# Patient Record
Sex: Female | Born: 1990 | Race: White | Hispanic: No | Marital: Single | Smoking: Never smoker
Health system: Southern US, Community
[De-identification: ages and names within clinical notes are randomized; demographics above are authoritative.]

## PROBLEM LIST (undated history)

## (undated) DIAGNOSIS — Z87898 Personal history of other specified conditions: Secondary | ICD-10-CM

## (undated) DIAGNOSIS — L409 Psoriasis, unspecified: Secondary | ICD-10-CM

## (undated) DIAGNOSIS — R569 Unspecified convulsions: Secondary | ICD-10-CM

## (undated) DIAGNOSIS — G43909 Migraine, unspecified, not intractable, without status migrainosus: Secondary | ICD-10-CM

## (undated) HISTORY — PX: BACK SURGERY: SHX140

## (undated) HISTORY — DX: Personal history of other specified conditions: Z87.898

---

## 2013-10-30 ENCOUNTER — Encounter: Payer: Self-pay | Admitting: Emergency Medicine

## 2013-10-30 ENCOUNTER — Emergency Department
Admission: EM | Admit: 2013-10-30 | Discharge: 2013-10-30 | Disposition: A | Discharge status: Home / Self Care | Payer: 59 | Source: Home / Self Care | Attending: Family Medicine | Admitting: Family Medicine

## 2013-10-30 DIAGNOSIS — J069 Acute upper respiratory infection, unspecified: Secondary | ICD-10-CM

## 2013-10-30 DIAGNOSIS — J029 Acute pharyngitis, unspecified: Secondary | ICD-10-CM

## 2013-10-30 HISTORY — DX: Psoriasis, unspecified: L40.9

## 2013-10-30 HISTORY — DX: Unspecified convulsions: R56.9

## 2013-10-30 HISTORY — DX: Migraine, unspecified, not intractable, without status migrainosus: G43.909

## 2013-10-30 LAB — POCT RAPID STREP A (OFFICE): Rapid Strep A Screen: NEGATIVE

## 2013-10-30 MED ORDER — BENZONATATE 200 MG PO CAPS: 200.0000 mg | ORAL_CAPSULE | Freq: Every day | ORAL | Status: DC

## 2013-10-30 MED ORDER — AMOXICILLIN 875 MG PO TABS: 875.0000 mg | ORAL_TABLET | Freq: Two times a day (BID) | ORAL | Status: DC

## 2013-10-30 NOTE — ED Notes (Signed)
Sore throat, nasal drainage, rt ear ringing x 6 days a little nausea today

## 2013-10-30 NOTE — ED Provider Notes (Signed)
CSN: [id number]     Arrival date & time 10/30/13  1547 History   First MD Initiated Contact with Patient 10/30/13 1604     Chief Complaint  Patient presents with  . Sore Throat      HPI Comments: Patient developed increased sinus drainage about 5 days ago, followed by fatigue, headache, soreness in her neck, and a sore throat.  Four days ago she developed a cough that has persisted, and her sore throat is now minimal.  The history is provided by the patient.    Past Medical History  Diagnosis Date  . Seizures   . Psoriasis   . Migraines    Past Surgical History  Procedure Laterality Date  . Back surgery     Family History  Problem Relation Age of Onset  . Supraventricular tachycardia Mother    History  Substance Use Topics  . Smoking status: Never Smoker   . Smokeless tobacco: Not on file  . Alcohol Use: Yes   OB History   Grav Para Term Preterm Abortions TAB SAB Ect Mult Living                 Review of Systems + sore throat + cough No pleuritic pain No wheezing + nasal congestion + post-nasal drainage No sinus pain/pressure No itchy/red eyes No earache No hemoptysis No SOB No fever/chills + nausea + vomiting, resolved + abdominal pain No diarrhea No urinary symptoms No skin rash + fatigue + myalgias + headache Used OTC meds without relief  Allergies  Pertussis vaccines  Home Medications   Current Outpatient Rx  Name  Route  Sig  Dispense  Refill  . ibuprofen (ADVIL,MOTRIN) 200 MG tablet   Oral   Take 200 mg by mouth every 6 (six) hours as needed.         Marland Kitchen. amoxicillin (AMOXIL) 875 MG tablet   Oral   Take 1 tablet (875 mg total) by mouth 2 (two) times daily. (Rx void after 11/07/13)   20 tablet   0   . benzonatate (TESSALON) 200 MG capsule   Oral   Take 1 capsule (200 mg total) by mouth at bedtime. Take as needed for cough   12 capsule   0    BP 137/83  Pulse 102  Temp(Src) 98 F (36.7 C) (Oral)  Ht 5\' 4"  (1.626 m)  Wt 203 lb  (92.08 kg)  BMI 34.83 kg/m2  SpO2 99% Physical Exam Nursing notes and Vital Signs reviewed. Appearance:  Patient appears stated age, and in no acute distress.  Patient is obese (BMI 34.8) Eyes:  Pupils are equal, round, and reactive to light and accomodation.  Extraocular movement is intact.  Conjunctivae are not inflamed  Ears:  Canals normal.  Tympanic membranes normal.  Nose:  Mildly congested turbinates.  No sinus tenderness.   Pharynx:   Minimal erythema Neck:  Supple.  Tender shotty posterior nodes are palpated bilaterally  Lungs:  Clear to auscultation.  Breath sounds are equal.  Chest:  Distinct tenderness to palpation over the mid-sternum.  Heart:  Regular rate and rhythm without murmurs, rubs, or gallops.  Abdomen:  Nontender without masses or hepatosplenomegaly.  Bowel sounds are present.  No CVA or flank tenderness.  Extremities:  No edema.  No calf tenderness Skin:  No rash present.   ED Course  Procedures  none   Labs Reviewed -  POCT rapid strep test negative       MDM   1. Acute  pharyngitis   2. Acute upper respiratory infections of unspecified site    There is no evidence of bacterial infection today.  Treat symptomatically for now: Prescription written for Benzonatate [facility]) to take at bedtime for night-time cough.  Take plain Mucinex (1200 mg guaifenesin) twice daily for cough and congestion.  May add Sudafed for sinus congestion.   Increase fluid intake, rest. May use Afrin nasal spray (or generic oxymetazoline) twice daily for about 5 days.  Also recommend using saline nasal spray several times daily and saline nasal irrigation (AYR is a common brand) Try warm salt water gargles for sore throat.  Stop all antihistamines for now, and other non-prescription cough/cold preparations. May take Ibuprofen 200mg , 4 tabs every 8 hours with food for chest/sternum discomfort, headache, fever, etc. Begin Amoxicillin if not improving about 5 days or if persistent  fever develops (Given a prescription to hold, with an expiration date)  Follow-up with family doctor if not improving 7 to 10 days.    Lattie Haw, MD 11/01/13 [phone]

## 2013-10-30 NOTE — Discharge Instructions (Signed)
Take plain Mucinex (1200 mg guaifenesin) twice daily for cough and congestion.  May add Sudafed for sinus congestion.   Increase fluid intake, rest. May use Afrin nasal spray (or generic oxymetazoline) twice daily for about 5 days.  Also recommend using saline nasal spray several times daily and saline nasal irrigation (AYR is a common brand) Try warm salt water gargles for sore throat.  Stop all antihistamines for now, and other non-prescription cough/cold preparations. May take Ibuprofen 200mg , 4 tabs every 8 hours with food for chest/sternum discomfort, headache, fever, etc. Begin Amoxicillin if not improving about 5 days or if persistent fever develops  Follow-up with family doctor if not improving 7 to 10 days.    Salt Water Gargle This solution will help make your mouth and throat feel better. HOME CARE INSTRUCTIONS   Mix 1 teaspoon of salt in 8 ounces of warm water.  Gargle with this solution as much or often as you need or as directed. Swish and gargle gently if you have any sores or wounds in your mouth.  Do not swallow this mixture. Document Released: 06/25/2004 Document Revised: 12/14/2011 Document Reviewed: 11/16/2008 [facility] Patient Information 2014 [place], [place].

## 2015-12-27 ENCOUNTER — Encounter: Payer: Self-pay | Admitting: Osteopathic Medicine

## 2015-12-27 ENCOUNTER — Ambulatory Visit (INDEPENDENT_AMBULATORY_CARE_PROVIDER_SITE_OTHER): Payer: 59 | Admitting: Osteopathic Medicine

## 2015-12-27 VITALS — BP 139/82 | HR 91 | Ht 63.0 in | Wt 195.0 lb

## 2015-12-27 DIAGNOSIS — Z8669 Personal history of other diseases of the nervous system and sense organs: Secondary | ICD-10-CM | POA: Diagnosis not present

## 2015-12-27 DIAGNOSIS — R002 Palpitations: Secondary | ICD-10-CM | POA: Diagnosis not present

## 2015-12-27 DIAGNOSIS — Z872 Personal history of diseases of the skin and subcutaneous tissue: Secondary | ICD-10-CM

## 2015-12-27 DIAGNOSIS — R946 Abnormal results of thyroid function studies: Secondary | ICD-10-CM | POA: Diagnosis not present

## 2015-12-27 DIAGNOSIS — R7989 Other specified abnormal findings of blood chemistry: Secondary | ICD-10-CM

## 2015-12-27 DIAGNOSIS — Z87898 Personal history of other specified conditions: Secondary | ICD-10-CM

## 2015-12-27 HISTORY — DX: Personal history of other specified conditions: Z87.898

## 2015-12-27 LAB — CBC WITH DIFFERENTIAL/PLATELET
BASOS PCT: 0 % (ref 0–1)
Basophils Absolute: 0 10*3/uL (ref 0.0–0.1)
EOS PCT: 1 % (ref 0–5)
Eosinophils Absolute: 0.1 10*3/uL (ref 0.0–0.7)
HCT: 39.4 % (ref 36.0–46.0)
Hemoglobin: 12.9 g/dL (ref 12.0–15.0)
LYMPHS ABS: 1.6 10*3/uL (ref 0.7–4.0)
Lymphocytes Relative: 31 % (ref 12–46)
MCH: 26.6 pg (ref 26.0–34.0)
MCHC: 32.7 g/dL (ref 30.0–36.0)
MCV: 81.2 fL (ref 78.0–100.0)
MONOS PCT: 7 % (ref 3–12)
MPV: 9.5 fL (ref 8.6–12.4)
Monocytes Absolute: 0.4 10*3/uL (ref 0.1–1.0)
NEUTROS PCT: 61 % (ref 43–77)
Neutro Abs: 3.1 10*3/uL (ref 1.7–7.7)
PLATELETS: 325 10*3/uL (ref 150–400)
RBC: 4.85 MIL/uL (ref 3.87–5.11)
RDW: 12.7 % (ref 11.5–15.5)
WBC: 5.1 10*3/uL (ref 4.0–10.5)

## 2015-12-27 LAB — TSH: TSH: 0.02 mIU/L — ABNORMAL LOW

## 2015-12-27 NOTE — Progress Notes (Signed)
HPI: Latoya Obrien is a 25 y.o. female who [facility] for chief complaint of:  Chief Complaint  Patient presents with  . Establish Care    Concerned about elevated heart rate during exercise    . Quality: Patient reports occasional heart racing, worse with exercise, very rarely happens on its own. . Duration: Ongoing past few months as patient has been increasing exercise, doing well with intentional weight loss . Timing: Worse with exercise . Context: On [employer] HR monitor, she tried it and HR was high.  . Modifying factors: Improves with rest . Assoc signs/symptoms: Reports some occasional chest discomfort with strenuous exercise Patient brings a log of heart rate, preventive 120s or so following exercise, patient is concerned because it seems to take up to 10 minutes or so to get back down into the lower 90s. She doesn't always report palpitations or chest pain with this, she is just keeping an eye on her heart rate with her fitness monitor   Past medical, social and family history reviewed: Past Medical History  Diagnosis Date  . Seizures (HCC)   . Psoriasis   . Migraines     once or twice per year, severe  . History of seizure 12/27/2015    History of seizure x 1 as child, age 89.    Past Surgical History  Procedure Laterality Date  . Back surgery     Social History  Substance Use Topics  . Smoking status: Never Smoker   . Smokeless tobacco: Not on file  . Alcohol Use: Yes   Family History  Problem Relation Age of Onset  . Supraventricular tachycardia Mother     No current outpatient prescriptions on file.   No current facility-administered medications for this visit.   Allergies  Allergen Reactions  . Pertussis Vaccines       Review of Systems: CONSTITUTIONAL:  No  fever, no chills, No  unintentional weight changesPatient reports intentional weight loss with exercise regimen,  HEAD/EYES/EARS/NOSE/THROAT: No   headache, no vision change, no hearing change, CARDIAC: (+) Occasional chest pain, No  pressure, (occasional +) palpitations, No  Orthopnea - see history of present illness  RESPIRATORY: No  cough, No  shortness of breath/wheeze GASTROINTESTINAL: No  nausea, No  vomiting, No  abdominal pain, GENITOURINARY: No  incontinence, No  abnormal genital bleeding/discharge SKIN: No  rash/wounds/concerning lesions HEM/ONC: No  easy bruising/bleeding, No  abnormal lymph node ENDOCRINE: No polyuria/polydipsia/polyphagia, No  heat/cold intolerance  NEUROLOGIC: No  weakness, No  dizziness, No  slurred speech PSYCHIATRIC: No  concerns with depression, No  concerns with anxiety, No sleep problems  Exam:  BP 139/82 mmHg  Pulse 91  Ht 5\' 3"  (1.6 m)  Wt 195 lb (88.451 kg)  BMI 34.55 kg/m2 Constitutional: VS see above. General Appearance: alert, well-developed, well-nourished, NAD Eyes: Normal lids and conjunctive, non-icteric sclera,  Ears, Nose, Mouth, Throat: MMM, Normal external inspection ears/nares/mouth/lips/gums,  Neck: No masses, trachea midline. No thyroid enlargement/tenderness/mass appreciated, thyroid at other limit of normal size. No lymphadenopathy Respiratory: Normal respiratory effort. no wheeze, no rhonchi, no rales Cardiovascular: S1/S2 normal, no murmur, no rub/gallop auscultated. RRR. No carotid bruit or JVD. Marland Kitchen. No lower extremity edema. Musculoskeletal: Gait normal. No clubbing/cyanosis of digits.  Neurological: No cranial nerve deficit on limited exam. Motor and sensation intact and symmetric Skin: warm, dry, intact. No rash/ulcer. No concerning nevi or subq nodules on limited exam.   Psychiatric: Normal judgment/insight. Normal mood and  affect. Oriented x3.    No results found for this or any previous visit (from the past 72 hour(s)).  EKG interpretation: Rate: 89 Rhythm: sinus No ST/T changes concerning for acute ischemia/infarct    ASSESSMENT/PLAN: no concerns on EKG, no  family history of WPW though mom does have history of SVT however this was associated with pregnancy. Labs as below to rule out secondary cause. ER/RTC precautions reviewed. Also reviewed American Heart Association target heart rate for exercise based on age. She is within normal parameters, would probably expect heart rate to decrease last baseline more quickly after cessation of exercise as cardiovascular fitness improves, patient also advised to do regular cooldown exercises with walking and stretching, which she has generally been doing.   Palpitations - Plan: EKG 12-Lead, CBC with Differential/Platelet, COMPLETE METABOLIC PANEL WITH GFR, TSH, Magnesium  History of seizure   Return in about 1 week (around 01/03/2016), or sooner if needed, for SKIN TAG/MOLE REMOVAL AND DISCUSS LAB RESULTS.

## 2015-12-28 ENCOUNTER — Encounter: Payer: Self-pay | Admitting: Osteopathic Medicine

## 2015-12-28 DIAGNOSIS — Z872 Personal history of diseases of the skin and subcutaneous tissue: Secondary | ICD-10-CM | POA: Insufficient documentation

## 2015-12-28 LAB — COMPLETE METABOLIC PANEL WITH GFR
ALT: 22 U/L (ref 6–29)
AST: 20 U/L (ref 10–30)
Albumin: 4.1 g/dL (ref 3.6–5.1)
Alkaline Phosphatase: 60 U/L (ref 33–115)
BUN: 8 mg/dL (ref 7–25)
CHLORIDE: 102 mmol/L (ref 98–110)
CO2: 25 mmol/L (ref 20–31)
Calcium: 9.2 mg/dL (ref 8.6–10.2)
Creat: 0.57 mg/dL (ref 0.50–1.10)
GLUCOSE: 83 mg/dL (ref 65–99)
POTASSIUM: 3.9 mmol/L (ref 3.5–5.3)
SODIUM: 140 mmol/L (ref 135–146)
Total Bilirubin: 0.5 mg/dL (ref 0.2–1.2)
Total Protein: 6.6 g/dL (ref 6.1–8.1)

## 2015-12-28 LAB — MAGNESIUM: MAGNESIUM: 1.7 mg/dL (ref 1.5–2.5)

## 2015-12-30 NOTE — Addendum Note (Signed)
Addended by: Deirdre PippinsALEXANDER, Kerrick Miler M on: 12/30/2015 08:32 AM   Modules accepted: Orders

## 2016-01-03 ENCOUNTER — Encounter: Payer: Self-pay | Admitting: Osteopathic Medicine

## 2016-01-03 ENCOUNTER — Ambulatory Visit (INDEPENDENT_AMBULATORY_CARE_PROVIDER_SITE_OTHER): Payer: 59 | Admitting: Osteopathic Medicine

## 2016-01-03 ENCOUNTER — Ambulatory Visit: Payer: 59

## 2016-01-03 VITALS — BP 120/78 | HR 91 | Ht 63.0 in | Wt 196.0 lb

## 2016-01-03 DIAGNOSIS — R946 Abnormal results of thyroid function studies: Secondary | ICD-10-CM

## 2016-01-03 DIAGNOSIS — E049 Nontoxic goiter, unspecified: Secondary | ICD-10-CM

## 2016-01-03 DIAGNOSIS — R7989 Other specified abnormal findings of blood chemistry: Secondary | ICD-10-CM

## 2016-01-03 LAB — T3: T3, Total: 231 ng/dL — ABNORMAL HIGH (ref 76–181)

## 2016-01-03 LAB — TSH: TSH: 0.01 m[IU]/L — AB

## 2016-01-03 LAB — T4, FREE: Free T4: 1.9 ng/dL — ABNORMAL HIGH (ref 0.8–1.8)

## 2016-01-03 MED ORDER — PROPRANOLOL HCL 20 MG PO TABS: 10.0000 mg | ORAL_TABLET | Freq: Four times a day (QID) | ORAL | Status: DC | PRN

## 2016-01-03 NOTE — Patient Instructions (Signed)
Hyperthyroidism Hyperthyroidism is when the thyroid is too active (overactive). Your thyroid is a large gland that is located in your neck. The thyroid helps to control how your body uses food (metabolism). When your thyroid is overactive, it produces too much of a hormone called thyroxine.  CAUSES Causes of hyperthyroidism may include:  Graves disease. This is when your immune system attacks the thyroid gland. This is the most common cause.  Inflammation of the thyroid gland.  Tumor in the thyroid gland or somewhere else.  Excessive use of thyroid medicines, including:  Prescription thyroid supplement.  Herbal supplements that mimic thyroid hormones.  Solid or fluid-filled lumps within your thyroid gland (thyroid nodules).  Excessive ingestion of iodine. RISK FACTORS  Being female.  Having a family history of thyroid conditions. SIGNS AND SYMPTOMS Signs and symptoms of hyperthyroidism may include:  Nervousness.  Inability to tolerate heat.  Unexplained weight loss.  Diarrhea.  Change in the texture of hair or skin.  Heart skipping beats or making extra beats.  Rapid heart rate.  Loss of menstruation.  Shaky hands.  Fatigue.  Restlessness.  Increased appetite.  Sleep problems.  Enlarged thyroid gland or nodules. DIAGNOSIS  Diagnosis of hyperthyroidism may include:  Medical history and physical exam.  Blood tests.  Ultrasound tests. TREATMENT Treatment may include:  Medicines to control your thyroid.  Surgery to remove your thyroid.  Radiation therapy. HOME CARE INSTRUCTIONS   Take medicines only as directed by your health care provider.  Do not use any tobacco products, including cigarettes, chewing tobacco, or electronic cigarettes. If you need help quitting, ask your health care provider.  Do not exercise or do physical activity until your health care provider approves.  Keep all follow-up appointments as directed by your health care  provider. This is important. SEEK MEDICAL CARE IF:  Your symptoms do not get better with treatment.  You have fever.  You are taking thyroid replacement medicine and you:  Have depression.  Feel mentally and physically slow.  Have weight gain. SEEK IMMEDIATE MEDICAL CARE IF:   You have decreased alertness or a change in your awareness.  You have abdominal pain.  You feel dizzy.  You have a rapid heartbeat.  You have an irregular heartbeat.   This information is not intended to replace advice given to you by your health care provider. Make sure you discuss any questions you have with your health care provider.   Document Released: 09/21/2005 Document Revised: 10/12/2014 Document Reviewed: 02/06/2014 Elsevier Interactive Patient Education 2016 Elsevier Inc.  

## 2016-01-03 NOTE — Progress Notes (Signed)
HPI: Latoya Obrien is a 25 y.o. female [facility] [place] [facility] for chief complaint of:  Chief Complaint  Patient presents with  . Follow-up    LABS    . Quality: Low TSH indicating Hyperthyroid, pt hasn't gotten additional labs done yet. Mom accompanies her today.  . Context: seen last week for concerns for palpitations, no concerns on EKG . Assoc signs/symptoms: palpitations, dry skin    Past medical, social and family history reviewed: Past Medical History  Diagnosis Date  . Seizures (HCC)   . Psoriasis   . Migraines     once or twice per year, severe  . History of seizure 12/27/2015    History of seizure x 1 as child, age 89.    Past Surgical History  Procedure Laterality Date  . Back surgery     Social History  Substance Use Topics  . Smoking status: Never Smoker   . Smokeless tobacco: Not on file  . Alcohol Use: Yes   Family History  Problem Relation Age of Onset  . Supraventricular tachycardia Mother     Current Outpatient Prescriptions  Medication Sig Dispense Refill  . propranolol (INDERAL) 20 MG tablet Take 0.5-1 tablets (10-20 mg total) by mouth every 6 (six) hours as needed (palpitations). 30 tablet 1   No current facility-administered medications for this visit.   Allergies  Allergen Reactions  . Pertussis Vaccines       Review of Systems: CONSTITUTIONAL:  No  fever, no chills, No  unintentional weight changes CARDIAC: No  chest pain, No  pressure, (+) palpitations, No  orthopnea RESPIRATORY: No  cough SKIN: No  rash/wounds/concerning lesions, (+) dry skin ENDOCRINE: No polyuria/polydipsia/polyphagia, No  heat/cold intolerance    Exam:  BP 120/78 mmHg  Pulse 91  Ht 5\' 3"  (1.6 m)  Wt 196 lb (88.905 kg)  BMI 34.73 kg/m2 Constitutional: VS see above. General Appearance: alert, well-developed, well-nourished, NAD Eyes: Normal lids and conjunctive, non-icteric sclera,  Ears, Nose, Mouth, Throat: MMM,  Normal external inspection ears/nares/mouth/lips/gums,.  Neck: No masses, trachea midline. (+) thyroid enlargement. No tenderness/mass appreciated. No lymphadenopathy Respiratory: Normal respiratory effort. no wheeze, no rhonchi, no rales Cardiovascular: S1/S2 normal, no murmur, no rub/gallop auscultated. RRR.   No results found for this or any previous visit (from the past 72 hour(s)).  Labs reviewed with patient - Low TSH  ASSESSMENT/PLAN: Low TSH, explained likely hyperthyroid based on this but we need more info before we have a good diagnosis since there can be many things which cause this issue. Plan for additional labs and imaging, possible uptake scan, refer to Endocrine. Propranolol prn, if tolerates and based on labs can consider non-prn dosing, RTC/ER precautions reviewed.   Abnormal thyroid blood test - Plan: US Soft Tissue Head/Neck, propranolol (INDERAL) 20 MG tablet, Ambulatory referral to Endocrinology  Enlarged thyroid gland - Plan: US Soft Tissue Head/Neck, Ambulatory referral to Endocrinology  Return in about 2 weeks (around 01/17/2016), or depending on test results - plan to return in 2 weeks or so to monitor blood pressure.

## 2016-01-06 LAB — THYROID PEROXIDASE ANTIBODY: Thyroperoxidase Ab SerPl-aCnc: 327 IU/mL — ABNORMAL HIGH (ref <9)

## 2016-01-06 LAB — THYROGLOBULIN LEVEL: Thyroglobulin: <0.1 ng/mL — ABNORMAL LOW (ref 2.8–40.9)

## 2016-01-07 ENCOUNTER — Ambulatory Visit (INDEPENDENT_AMBULATORY_CARE_PROVIDER_SITE_OTHER): Payer: 59

## 2016-01-07 DIAGNOSIS — E010 Iodine-deficiency related diffuse (endemic) goiter: Secondary | ICD-10-CM

## 2016-01-07 DIAGNOSIS — R946 Abnormal results of thyroid function studies: Secondary | ICD-10-CM

## 2016-01-08 LAB — THYROID STIMULATING IMMUNOGLOBULIN: TSI: 455 % baseline — ABNORMAL HIGH (ref <140)

## 2016-01-10 ENCOUNTER — Ambulatory Visit (INDEPENDENT_AMBULATORY_CARE_PROVIDER_SITE_OTHER): Payer: 59 | Admitting: Osteopathic Medicine

## 2016-01-10 DIAGNOSIS — Z5329 Procedure and treatment not carried out because of patient's decision for other reasons: Secondary | ICD-10-CM

## 2016-01-10 NOTE — Progress Notes (Signed)
NO SHOW

## 2016-01-27 ENCOUNTER — Encounter: Payer: Self-pay | Admitting: Osteopathic Medicine

## 2016-01-27 ENCOUNTER — Ambulatory Visit (INDEPENDENT_AMBULATORY_CARE_PROVIDER_SITE_OTHER): Payer: 59 | Admitting: Osteopathic Medicine

## 2016-01-27 VITALS — BP 138/85 | HR 91 | Ht 63.0 in | Wt 191.0 lb

## 2016-01-27 DIAGNOSIS — R002 Palpitations: Secondary | ICD-10-CM | POA: Diagnosis not present

## 2016-01-27 DIAGNOSIS — E0590 Thyrotoxicosis, unspecified without thyrotoxic crisis or storm: Secondary | ICD-10-CM | POA: Diagnosis not present

## 2016-01-27 DIAGNOSIS — R635 Abnormal weight gain: Secondary | ICD-10-CM | POA: Diagnosis not present

## 2016-01-27 NOTE — Progress Notes (Signed)
HPI: Latoya Obrien is a 25 y.o. female who [facility] [place] today for chief complaint of:  Chief Complaint  Patient presents with  . Thyroid Problem    Patient stated that she has a sudden weight gain      . Quality: 7.4 lb weight gain over the weekend . Severity:  o weight here today is 191 lb but pt reports at home today was 192.4 lb up from 3 days ago o weight at home 01/24/16 (3 days ago) was 185.0 lb o weight at endocrine 01/22/16 was 188 lb o weight here 01/03/16 was 196 lb . Context: Recently referred to Endocrine for hyperthyroid found on w/u for palpitations, Dr. Shawnee KnappLevy started methimazole but patient only started taking this last night.  . Modifying factors: just started taking the Methimazole, not using the propranolol. . Assoc signs/symptoms: Bad fatigue and some chest pain when exercising on Friday, she hadn't started the medication at that point. Measured HR Friday and it was 145 when done running. Slight cough yesterday but nothing now. If she hadn't noticed the weight gain, she would ont have any major concerns.    Past medical, social and family history reviewed: Past Medical History  Diagnosis Date  . Seizures (HCC)   . Psoriasis   . Migraines     once or twice per year, severe  . History of seizure 12/27/2015    History of seizure x 1 as child, age 89.    Past Surgical History  Procedure Laterality Date  . Back surgery     Social History  Substance Use Topics  . Smoking status: Never Smoker   . Smokeless tobacco: Not on file  . Alcohol Use: Yes   Family History  Problem Relation Age of Onset  . Supraventricular tachycardia Mother     Current Outpatient Prescriptions  Medication Sig Dispense Refill  . propranolol (INDERAL) 20 MG tablet Take 0.5-1 tablets (10-20 mg total) by mouth every 6 (six) hours as needed (palpitations). 30 tablet 1   No current facility-administered medications for this visit.   Allergies   Allergen Reactions  . Pertussis Vaccines       Review of Systems: CONSTITUTIONAL:  No  fever, no chills, (+) unintentional weight changes HEAD/EYES/EARS/NOSE/THROAT: No  headache, no vision change CARDIAC: No  chest pain, No  pressure, No palpitations, No  orthopnea RESPIRATORY: No  cough, No  shortness of breath/wheeze GASTROINTESTINAL: No  nausea, No  vomiting, No  abdominal pain NEUROLOGIC: No  weakness, No  Dizziness  Exam:  BP 138/85 mmHg  Pulse 91  Ht 5\' 3"  (1.6 m)  Wt 191 lb (86.637 kg)  BMI 33.84 kg/m2 Constitutional: VS see above. General Appearance: alert, well-developed, well-nourished, NAD Eyes: Normal lids and conjunctive, non-icteric sclera, PERRLA Ears, Nose, Mouth, Throat: MMM, Neck: No masses, trachea midline. (+) thyroid enlargement, no tenderness/mass appreciated. No lymphadenopathy, no JVD Respiratory: Normal respiratory effort. no wheeze, no rhonchi, no rales Cardiovascular: S1/S2 normal, no murmur, no rub/gallop auscultated. RRR. No carotid bruit or JVD. Pedal pulse II/IV bilaterally DP and PT. No lower extremity edema. Skin: warm, dry, intact.  Psychiatric: Normal judgment/insight. Anxious mood and affect.    No results found for this or any previous visit (from the past 72 hour(s)).    ASSESSMENT/PLAN: Reassured no heart failure, thought I'm not sure what to make of weight gain based on patient's scale at home other than possible inaccurate device - pt has lost weight overall since she  has been seeing me in this office. Advised that overall clinical picture is more important to my evaluation than isolated abnormal weight measurements, and she is feeling otherwise normal. Advised can come here for weight checks if desired. ER/RTC precautions reviewed.   Hyperthyroidism - Advised pt there can be HF problems with extreme thyroid disease but I do not think this is the case with her. Continue Methimazole and f/u endocrine as directe  Palpitations - Advise  decrease intensity of exercise unti lthyroid better controlled - brisk walking fine but avoid running or significant exertion. RTC/ER precautions given  Weight gain - insignificant change when comparing in-office measurements - possible inaccurate home scale. No clinical HF concerns at this time.    Return if symptoms worsen or fail to improve, and as directed by [facility] for thyroid.

## 2016-06-29 IMAGING — US US SOFT TISSUE HEAD/NECK
1 series · 14 of 25 positions shown · non-contrast
Comparison: None.

CLINICAL DATA: Thyromegaly on physical exam, abnormal blood test

EXAM:
THYROID ULTRASOUND
TECHNIQUE: Ultrasound examination of the thyroid gland and adjacent soft
tissues was performed.

[Series 1: us soft tissue head/neck · 0.07mm/px · 14 of 37 slices shown]
[im 1/37]
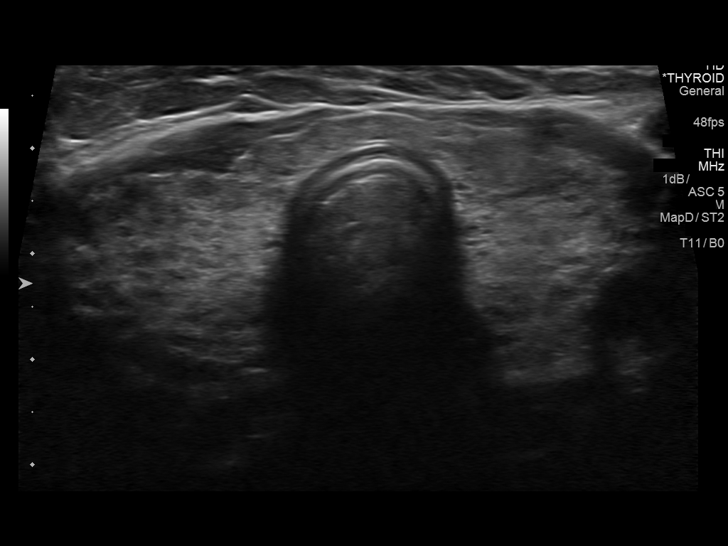
[im 4/37]
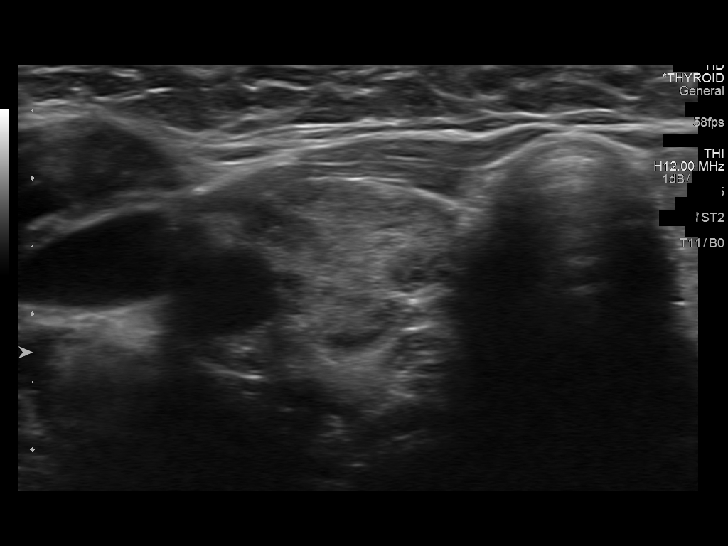
[im 7/37]
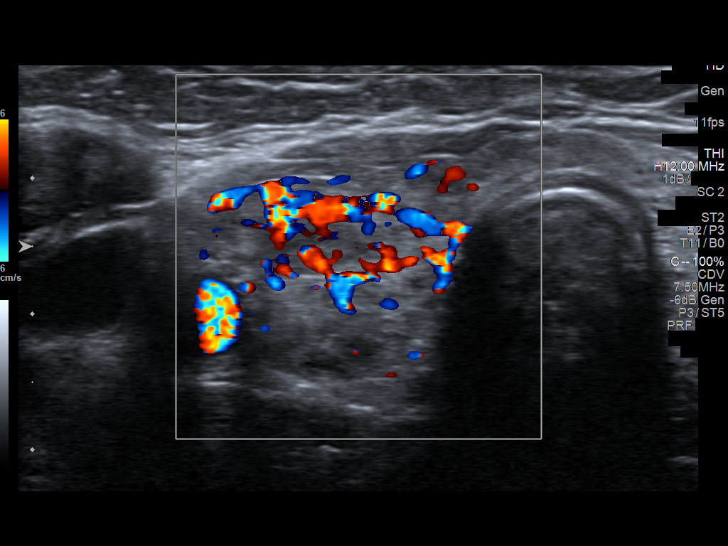
[im 10/37]
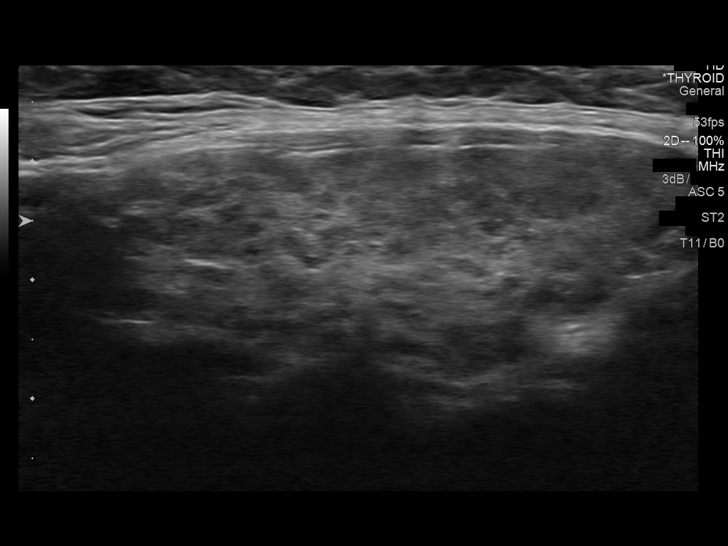
[im 13/37]
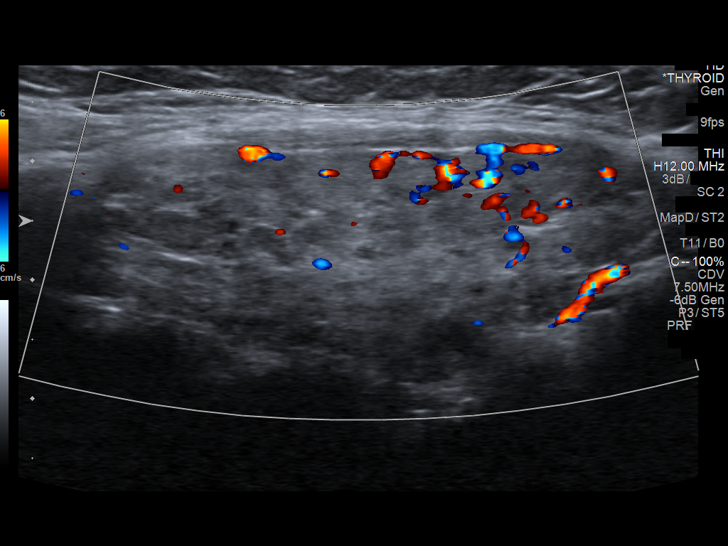
[im 14/37]
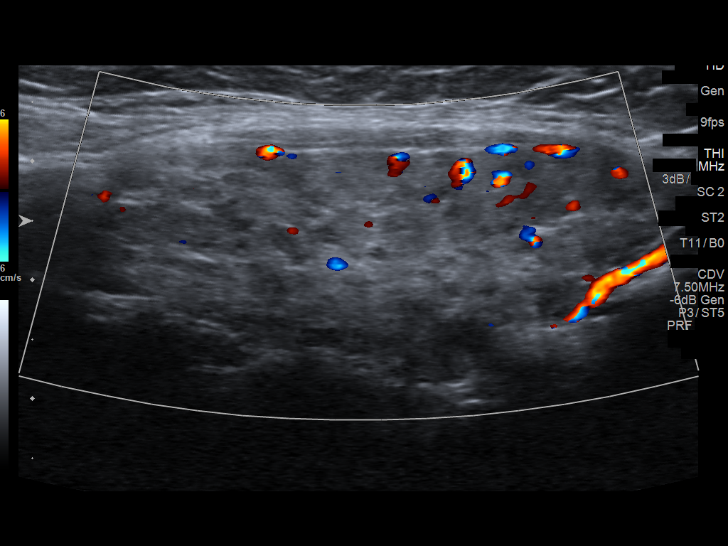
[im 17/37]
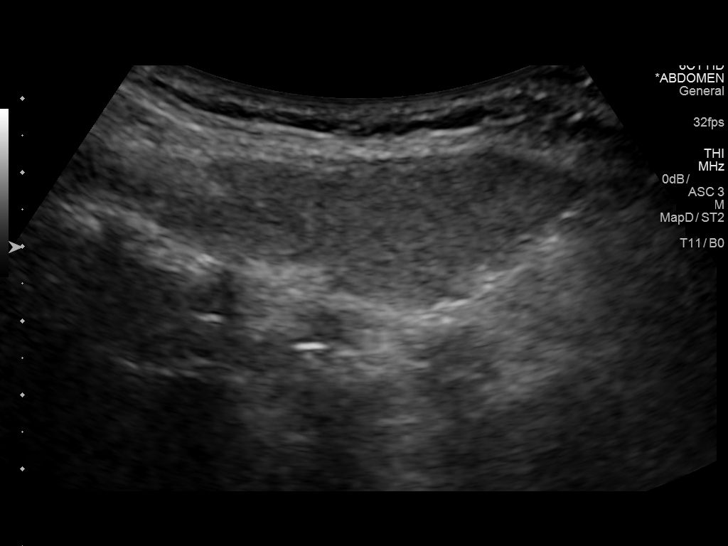
[im 20/37]
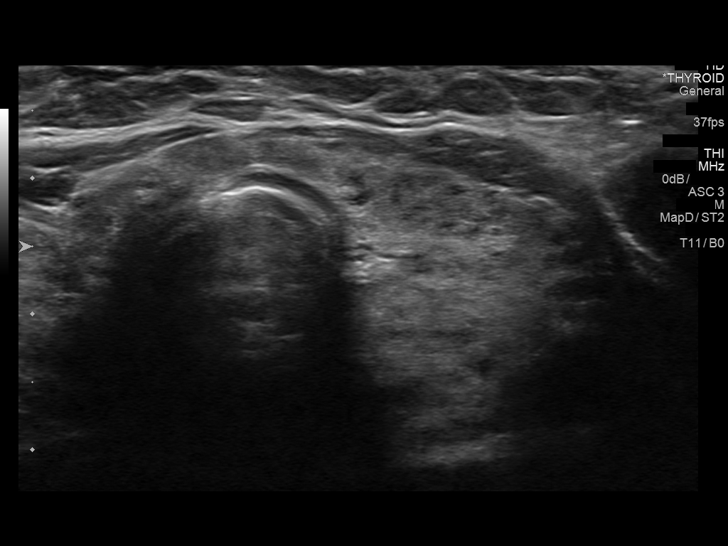
[im 23/37]
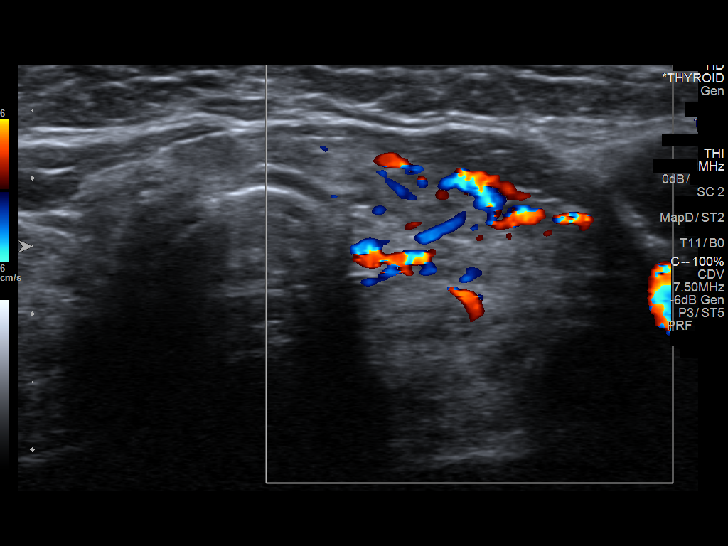
[im 25/37]
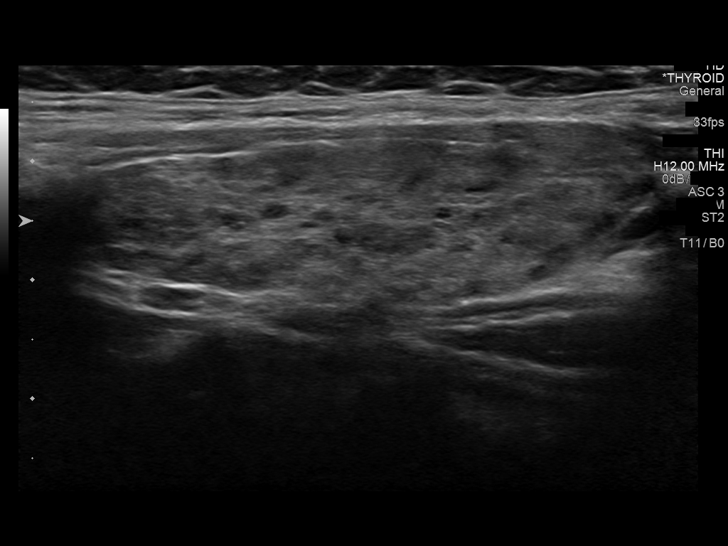
[im 28/37]
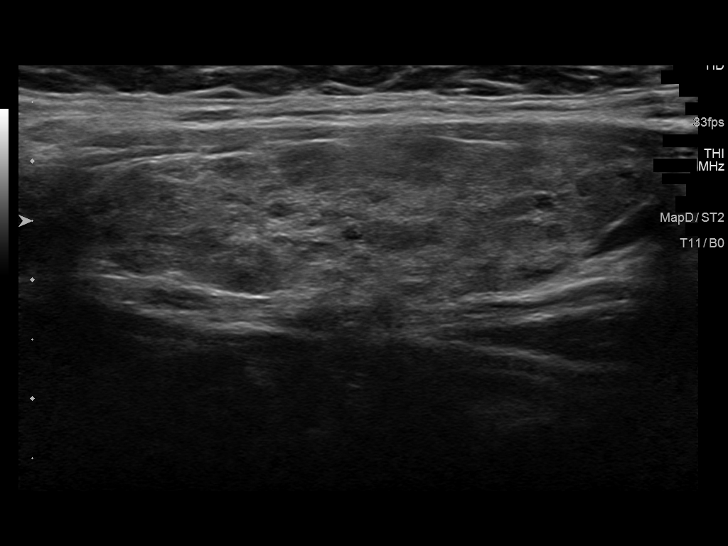
[im 31/37]
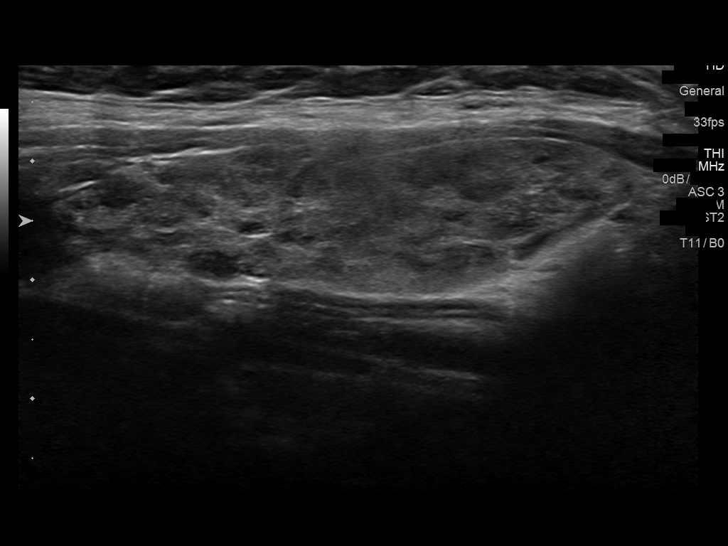
[im 34/37]
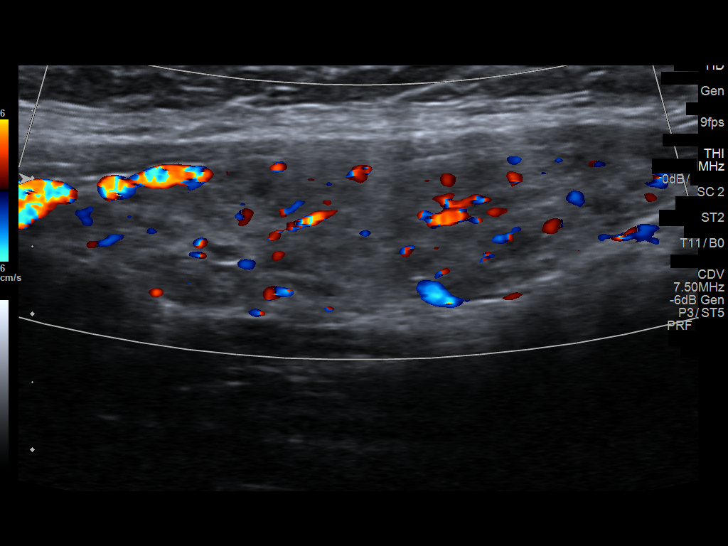
[im 37/37]
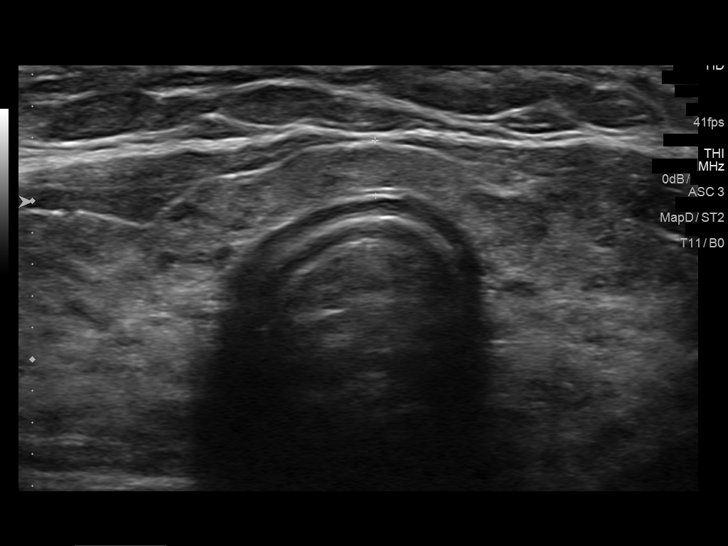

[14 of 25 positions shown; findings below may reference images not displayed]

FINDINGS: Right thyroid lobe

Measurements: 65 x 22 x 21 mm. Markedly inhomogeneous background
echotexture with mild hyperemia. No dominant nodule or mass.

Left thyroid lobe

Measurements: 60 x 14 x 18 mm.  No dominant nodules visualized.

Isthmus

Thickness: 4 mm.  No nodules visualized.

Lymphadenopathy

None visualized.
IMPRESSION: 1. Thyromegaly with mild hyperemia, no  dominant nodule or mass.

## 2016-08-20 ENCOUNTER — Encounter: Payer: Self-pay | Admitting: Sports Medicine

## 2016-08-20 ENCOUNTER — Ambulatory Visit (INDEPENDENT_AMBULATORY_CARE_PROVIDER_SITE_OTHER): Payer: 59 | Admitting: Sports Medicine

## 2016-08-20 DIAGNOSIS — M222X2 Patellofemoral disorders, left knee: Secondary | ICD-10-CM

## 2016-08-20 DIAGNOSIS — M222X1 Patellofemoral disorders, right knee: Secondary | ICD-10-CM | POA: Diagnosis not present

## 2016-08-20 DIAGNOSIS — M7062 Trochanteric bursitis, left hip: Secondary | ICD-10-CM | POA: Diagnosis not present

## 2016-08-20 DIAGNOSIS — M7061 Trochanteric bursitis, right hip: Secondary | ICD-10-CM | POA: Diagnosis not present

## 2016-08-20 MED ORDER — MELOXICAM 15 MG PO TABS: 15.0000 mg | ORAL_TABLET | Freq: Every day | ORAL | 0 refills | Status: DC

## 2016-08-20 NOTE — Assessment & Plan Note (Signed)
Meloxicam and aggressive hip abductor and vastus medialis rehabilitation. Weak hip abductors, we will recheck the strength in 6 weeks.

## 2016-08-20 NOTE — Progress Notes (Signed)
   Subjective:    I'm seeing this patient as a consultation for:   Dr. Sunnie NielsenNatalie Obrien  CC: Hip and knee pain  HPI: For months this pleasant 25 year old female who has been trying to lose weight and get in shape has had pain that she localizes under both kneecaps, worse with sitting, squatting, going up and down stairs, as well as pain on the lateral aspect of both hips, worse with laying on the lateral sides, moderate, persistent.  Past medical history:  Negative.  See flowsheet/record as well for more information.  Surgical history: Negative.  See flowsheet/record as well for more information.  Family history: Negative.  See flowsheet/record as well for more information.  Social history: Negative.  See flowsheet/record as well for more information.  Allergies, and medications have been entered into the medical record, reviewed, and no changes needed.   Review of Systems: No headache, visual changes, nausea, vomiting, diarrhea, constipation, dizziness, abdominal pain, skin rash, fevers, chills, night sweats, weight loss, swollen lymph nodes, body aches, joint swelling, muscle aches, chest pain, shortness of breath, mood changes, visual or auditory hallucinations.   Objective:   General: Well Developed, well nourished, and in no acute distress.  Neuro/Psych: Alert and oriented x3, extra-ocular muscles intact, able to move all 4 extremities, sensation grossly intact. Skin: Warm and dry, no rashes noted.  Respiratory: Not using accessory muscles, speaking in full sentences, trachea midline.  Cardiovascular: Pulses palpable, no extremity edema. Abdomen: Does not appear distended. Bilateral knees: Normal to inspection with no erythema or effusion or obvious bony abnormalities. Tender to palpation at the medial and lateral patellar facets ROM normal in flexion and extension and lower leg rotation. Ligaments with solid consistent endpoints including ACL, PCL, LCL, MCL. Negative Mcmurray's  and provocative meniscal tests. Non painful patellar compression. Patellar and quadriceps tendons unremarkable. Hamstring and quadriceps strength is normal. Bilateral hips: ROM IR: 60 Deg, ER: 60 Deg, Flexion: 120 Deg, Extension: 100 Deg, Abduction: 45 Deg, Adduction: 45 Deg Strength IR: 5/5, ER: 5/5, Flexion: 5/5, Extension: 5/5, Abduction: 4/5, Adduction: 5/5 Pelvic alignment unremarkable to inspection and palpation. Standing hip rotation and gait without trendelenburg / unsteadiness. Greater trochanter with tenderness to palpation. No tenderness over piriformis. No SI joint tenderness and normal minimal SI movement.  Impression and Recommendations:   This case required medical decision making of moderate complexity.  Trochanteric bursitis of both hips Meloxicam and aggressive hip abductor rehabilitation.  Patellofemoral arthralgia of both knees Meloxicam and aggressive hip abductor and vastus medialis rehabilitation. Weak hip abductors, we will recheck the strength in 6 weeks.

## 2016-08-20 NOTE — Patient Instructions (Signed)
Hip Rehabilitation Protocol:  1.  Side leg raises.  3x30 with no weight, then 3x15 with 2 lb ankle weight, then 3x15 with 5 lb ankle weight 2.  Standing hip rotation.  3x30 with no weight, then 3x15 with 2 lb ankle weight, then 3x15 with 5 lb ankle weight. 3.  Side step ups.  3x30 with no weight, then 3x15 with 5 lbs in backpack, then 3x15 with 10 lbs in backpack. 

## 2016-08-20 NOTE — Assessment & Plan Note (Signed)
Meloxicam and aggressive hip abductor rehabilitation.

## 2016-09-16 ENCOUNTER — Ambulatory Visit: Payer: 59 | Admitting: Osteopathic Medicine

## 2016-09-17 ENCOUNTER — Encounter: Payer: Self-pay | Admitting: Osteopathic Medicine

## 2016-09-17 ENCOUNTER — Ambulatory Visit (INDEPENDENT_AMBULATORY_CARE_PROVIDER_SITE_OTHER): Payer: 59 | Admitting: Osteopathic Medicine

## 2016-09-17 VITALS — BP 133/67 | HR 78 | Ht 63.0 in | Wt 176.0 lb

## 2016-09-17 DIAGNOSIS — Z7189 Other specified counseling: Secondary | ICD-10-CM | POA: Diagnosis not present

## 2016-09-17 DIAGNOSIS — Z23 Encounter for immunization: Secondary | ICD-10-CM | POA: Diagnosis not present

## 2016-09-17 DIAGNOSIS — Z7185 Encounter for immunization safety counseling: Secondary | ICD-10-CM

## 2016-09-17 DIAGNOSIS — Z111 Encounter for screening for respiratory tuberculosis: Secondary | ICD-10-CM | POA: Diagnosis not present

## 2016-09-17 NOTE — Progress Notes (Signed)
HPI: Latoya Obrien is a 25 y.o. female  who presents to [facility] [facility] today, 09/17/16,  for chief complaint of:  Chief Complaint  Patient presents with  . Other    IMMUNIZATIONS AND PAPERWORK     Patient has some paperwork for college, needs proof of immunization.  She moved around a lot as a child, was not able to track down all of her childhood vaccine records. We do not have complete records of tetanus/diphtheria/pertussis or measles/mumps/rubella. Paperwork states that we need to have proof of the DTaP or Tdap Vaccines, titers are acceptable for MMR.    Past medical, surgical, social and family history reviewed: Patient Active Problem List   Diagnosis Date Noted  . Patellofemoral arthralgia of both knees 08/20/2016  . Trochanteric bursitis of both hips 08/20/2016  . Weight gain 01/27/2016  . Hyperthyroidism 01/27/2016  . Low TSH level 01/03/2016  . History of psoriasis 12/28/2015  . History of seizure 12/27/2015  . Palpitations 12/27/2015   Past Surgical History:  Procedure Laterality Date  . BACK SURGERY     Social History  Substance Use Topics  . Smoking status: Never Smoker  . Smokeless tobacco: Not on file  . Alcohol use Yes   Family History  Problem Relation Age of Onset  . Supraventricular tachycardia Mother      Current medication list and allergy/intolerance information reviewed:   Current Outpatient Prescriptions on File Prior to Visit  Medication Sig Dispense Refill  . meloxicam (MOBIC) 15 MG tablet Take 1 tablet (15 mg total) by mouth daily. 30 tablet 0  . methimazole (TAPAZOLE) 5 MG tablet Take 5 mg by mouth daily.    . propranolol (INDERAL) 20 MG tablet Take 0.5-1 tablets (10-20 mg total) by mouth every 6 (six) hours as needed (palpitations). 30 tablet 1   No current facility-administered medications on file prior to visit.    Allergies  Allergen Reactions  . Pertussis Vaccines       Review of  Systems:  Constitutional: No recent illness   Exam:  BP 133/67   Pulse 78   Ht 5' 3"  (1.6 m)   Wt 176 lb (79.8 kg)   BMI 31.18 kg/m   Constitutional: VS see above. General Appearance: alert, well-developed, well-nourished, NAD.   Respiratory: Normal respiratory effort.  Skin: warm, dry, intact.   Psychiatric: Normal judgment/insight. Normal mood and affect. Oriented x3.     Immunization History  Administered Date(s) Administered  . Tdap 09/17/2016    ASSESSMENT/PLAN:   Vaccine counseling - Plan: Diphtheria / Tetanus Antibody Panel, Measles/Mumps/Rubella Immunity  Need for diphtheria-tetanus-pertussis (Tdap) vaccine, adult/adolescent - Plan: Tdap vaccine greater than or equal to 7yo IM  Tuberculosis screening - Plan: TB Skin Test    Patient Instructions  Plan:  Go to Urgent Care on Saturday (48 hours after TB test placed) to have the result interpreted by a nurse.   Go to lab today to get vaccine titers  Make your best attempt to get previous Tetanus/Diphtheria and MMR vaccines confirmed  Once I have titer and TB results, I will write a letter to support your immunization status as complete, assuming the titers show adequate immunity  Can drop off the form on Monday and I can complete everything and get it back to you with the letter attached.   Sorry for all the hassle! If [employer] needs anything else form me, I'm happy to help!     Visit summary with medication list and pertinent  instructions was printed for patient to review. All questions at time of visit were answered - patient instructed to contact office with any additional concerns. ER/RTC precautions were reviewed with the patient. Follow-up plan: Return for follow-up as needed/as previously directed for routine care.  Note: Total time spent 15 minutes, greater than 50% of the visit was spent face-to-face counseling and coordinating care for the following: The primary encounter diagnosis was Vaccine  counseling. Diagnoses of Need for diphtheria-tetanus-pertussis (Tdap) vaccine, adult/adolescent and Tuberculosis screening were also pertinent to this visit.Marland Kitchen

## 2016-09-17 NOTE — Patient Instructions (Signed)
Plan:  Go to Urgent Care on Saturday (48 hours after TB test placed) to have the result interpreted by a nurse.   Go to lab today to get vaccine titers  Make your best attempt to get previous Tetanus/Diphtheria and MMR vaccines confirmed  Once I have titer and TB results, I will write a letter to support your immunization status as complete, assuming the titers show adequate immunity  Can drop off the form on Monday and I can complete everything and get it back to you with the letter attached.   Sorry for all the hassle! If [employer] needs anything else form me, I'm happy to help!

## 2016-09-18 LAB — MEASLES/MUMPS/RUBELLA IMMUNITY
Mumps IgG: 9.72 AU/mL — ABNORMAL HIGH (ref <9.00)
RUBELLA: 2.97 {index} — AB (ref <0.90)

## 2016-09-19 ENCOUNTER — Emergency Department
Admission: EM | Admit: 2016-09-19 | Discharge: 2016-09-19 | Disposition: A | Discharge status: Home / Self Care | Payer: 59 | Source: Home / Self Care

## 2016-09-19 LAB — TB SKIN TEST
INDURATION: 0 mm
TB Skin Test: NEGATIVE

## 2016-09-19 NOTE — ED Triage Notes (Signed)
Pt to [facility] had a PPD placement read that was placed in Primary Care on 12/14.  Patient presented the left arm with a circle in marker.  0mm noted on arm, no redness or bump noted.  Form filled out and placed in scan file.

## 2016-09-20 LAB — DIPHTHERIA / TETANUS ANTIBODY PANEL
Diphtheria Ab: 0.75 IU/mL
Tetanus Toxin Antibody, Total: 0.32 IU/mL (ref >0.15)

## 2016-09-21 ENCOUNTER — Ambulatory Visit: Payer: 59 | Admitting: Osteopathic Medicine

## 2016-09-21 ENCOUNTER — Encounter: Payer: Self-pay | Admitting: Osteopathic Medicine

## 2016-09-23 ENCOUNTER — Telehealth: Payer: Self-pay | Admitting: Osteopathic Medicine

## 2016-09-23 NOTE — Telephone Encounter (Signed)
Latoya Obrien, did these get mailed? If not mailed yet, we can call pt and have her pick these up or see if they need faxed somewhere

## 2016-09-23 NOTE — Telephone Encounter (Signed)
Pt brought in paper work on Monday to be completed and she called today to see if it was done and I advised her that you were out of the office the past couple days. She stated that she needs the forms to be turned in by Friday afternoon. Thanks

## 2016-09-25 NOTE — Telephone Encounter (Signed)
Spoke to patient she is coming to pick up paperwork today. Rhonda Cunningham,CMA

## 2016-10-07 ENCOUNTER — Ambulatory Visit: Payer: 59 | Admitting: Sports Medicine

## 2016-10-30 ENCOUNTER — Ambulatory Visit: Payer: 59 | Admitting: Sports Medicine

## 2017-06-21 ENCOUNTER — Encounter: Payer: Self-pay | Admitting: Osteopathic Medicine

## 2017-06-21 ENCOUNTER — Ambulatory Visit (INDEPENDENT_AMBULATORY_CARE_PROVIDER_SITE_OTHER): Payer: 59 | Admitting: Osteopathic Medicine

## 2017-06-21 VITALS — BP 129/81 | HR 76 | Ht 63.0 in | Wt 171.0 lb

## 2017-06-21 DIAGNOSIS — Z7185 Encounter for immunization safety counseling: Secondary | ICD-10-CM

## 2017-06-21 DIAGNOSIS — Z23 Encounter for immunization: Secondary | ICD-10-CM

## 2017-06-21 DIAGNOSIS — Z7189 Other specified counseling: Secondary | ICD-10-CM

## 2017-06-21 NOTE — Progress Notes (Signed)
HPI: Latoya Obrien is a 26 y.o. female  who presents to [facility] today, 06/21/17,  for chief complaint of:  Chief Complaint  Patient presents with  . Immunizations    States that her school is requiring a tetanus booster - she had Tdap last December. THere were some issues getting records since she's moved around a good bit as a child, but titers demonstrated appropriate MMR immunity and TB testing negative. She has no reason to believe she is deficient in any of the required and usual childhood vaccines.     Past medical history, surgical history, social history and family history reviewed.  Patient Active Problem List   Diagnosis Date Noted  . Patellofemoral arthralgia of both knees 08/20/2016  . Trochanteric bursitis of both hips 08/20/2016  . Weight gain 01/27/2016  . Hyperthyroidism 01/27/2016  . Low TSH level 01/03/2016  . History of psoriasis 12/28/2015  . History of seizure 12/27/2015  . Palpitations 12/27/2015    Current medication list and allergy/intolerance information reviewed.   Current Outpatient Prescriptions on File Prior to Visit  Medication Sig Dispense Refill  . methimazole (TAPAZOLE) 5 MG tablet Take 5 mg by mouth daily.     No current facility-administered medications on file prior to visit.    Allergies  Allergen Reactions  . Pertussis Vaccines       Review of Systems:  Constitutional: No recent illness, feels well today   Cardiac: No  chest pain, No  pressure  Respiratory:  No  shortness of breath. No  Cough  Exam:  BP 129/81   Pulse 76   Ht _0  (1.6 m)   Wt 171 lb (77.6 kg)   BMI 30.29 kg/m   Constitutional: VS see above. General Appearance: alert, well-developed, well-nourished, NAD  Psychiatric: Normal judgment/insight. Normal mood and affect. Oriented x3.     Immunization History  Administered Date(s) Administered  . Influenza,inj,Quad PF,6+ Mos 06/21/2017  . PPD Test 09/17/2016  . Tdap  09/17/2016     ASSESSMENT/PLAN:   Vaccine counseling - Will call school student health and see what they need from [place], but unless she needs the meningitis vaccine or something else or updated TB screen, not sure  Need for immunization against influenza - Plan: Flu Vaccine QUAD 36+ mos IM      Follow-up plan: Return for [place], RECOMMEND PAP .  Visit summary with medication list and pertinent instructions was printed for patient to review, alert [place] if any changes needed. All questions at time of visit were answered - patient instructed to contact office with any additional concerns. ER/RTC precautions were reviewed with the patient and understanding verbalized.   Note: Total time spent 10 minutes, greater than 50% of the visit was spent face-to-face counseling and coordinating care for the following: The primary encounter diagnosis was Vaccine counseling. A diagnosis of Need for immunization against influenza was also pertinent to this visit.Marland Kitchen

## 2017-06-29 ENCOUNTER — Encounter: Payer: Self-pay | Admitting: Osteopathic Medicine

## 2017-06-29 ENCOUNTER — Telehealth: Payer: Self-pay | Admitting: Osteopathic Medicine

## 2017-06-29 NOTE — Progress Notes (Signed)
See letter.

## 2017-06-29 NOTE — Telephone Encounter (Signed)
See MyChart message, I was unable to reach the patient by phone.

## 2017-06-30 ENCOUNTER — Ambulatory Visit (INDEPENDENT_AMBULATORY_CARE_PROVIDER_SITE_OTHER): Payer: 59 | Admitting: Osteopathic Medicine

## 2017-06-30 VITALS — Temp 98.8°F

## 2017-06-30 DIAGNOSIS — Z23 Encounter for immunization: Secondary | ICD-10-CM | POA: Diagnosis not present

## 2017-06-30 NOTE — Progress Notes (Signed)
   Subjective:    Patient ID: Latoya Obrien, female    DOB: 25-Aug-1991, 26 y.o.   MRN: [id number]  HPI  Latoya Obrien needs a Td for college, Per [employer].   Review of Systems     Objective:   Physical Exam        Assessment & Plan:  Td vaccine - Per Dr Lyn Hollingshead Td given without complications.
